# Patient Record
Sex: Male | Born: 1990 | Race: White | Hispanic: No | Smoking: Current every day smoker
Health system: Southern US, Community
[De-identification: ages and names within clinical notes are randomized; demographics above are authoritative.]

## PROBLEM LIST (undated history)

## (undated) DIAGNOSIS — M419 Scoliosis, unspecified: Secondary | ICD-10-CM

---

## 2014-11-19 ENCOUNTER — Emergency Department (HOSPITAL_COMMUNITY): Payer: No Typology Code available for payment source

## 2014-11-19 ENCOUNTER — Encounter (HOSPITAL_COMMUNITY): Payer: Self-pay | Admitting: Neurology

## 2014-11-19 ENCOUNTER — Emergency Department (HOSPITAL_COMMUNITY)
Admission: EM | Admit: 2014-11-19 | Discharge: 2014-11-19 | Disposition: A | Discharge status: Home / Self Care | Payer: No Typology Code available for payment source | Attending: Emergency Medicine | Admitting: Emergency Medicine

## 2014-11-19 DIAGNOSIS — Y9389 Activity, other specified: Secondary | ICD-10-CM | POA: Insufficient documentation

## 2014-11-19 DIAGNOSIS — M419 Scoliosis, unspecified: Secondary | ICD-10-CM | POA: Insufficient documentation

## 2014-11-19 DIAGNOSIS — Y998 Other external cause status: Secondary | ICD-10-CM | POA: Diagnosis not present

## 2014-11-19 DIAGNOSIS — S29001A Unspecified injury of muscle and tendon of front wall of thorax, initial encounter: Secondary | ICD-10-CM | POA: Diagnosis not present

## 2014-11-19 DIAGNOSIS — S199XXA Unspecified injury of neck, initial encounter: Secondary | ICD-10-CM | POA: Diagnosis present

## 2014-11-19 DIAGNOSIS — S299XXA Unspecified injury of thorax, initial encounter: Secondary | ICD-10-CM | POA: Diagnosis not present

## 2014-11-19 DIAGNOSIS — Y92410 Unspecified street and highway as the place of occurrence of the external cause: Secondary | ICD-10-CM | POA: Insufficient documentation

## 2014-11-19 DIAGNOSIS — M5020 Other cervical disc displacement, unspecified cervical region: Secondary | ICD-10-CM | POA: Insufficient documentation

## 2014-11-19 DIAGNOSIS — S0990XA Unspecified injury of head, initial encounter: Secondary | ICD-10-CM | POA: Insufficient documentation

## 2014-11-19 DIAGNOSIS — S161XXA Strain of muscle, fascia and tendon at neck level, initial encounter: Secondary | ICD-10-CM | POA: Diagnosis not present

## 2014-11-19 DIAGNOSIS — F17200 Nicotine dependence, unspecified, uncomplicated: Secondary | ICD-10-CM | POA: Insufficient documentation

## 2014-11-19 HISTORY — DX: Scoliosis, unspecified: M41.9

## 2014-11-19 MED ORDER — MORPHINE SULFATE (PF) 4 MG/ML IV SOLN
4.0000 mg | Freq: Once | INTRAVENOUS | Status: AC
  Administered 2014-11-19: 4 mg via INTRAVENOUS
  Filled 2014-11-19: qty 1

## 2014-11-19 MED ORDER — HYDROCODONE-ACETAMINOPHEN 5-325 MG PO TABS
1.0000 | ORAL_TABLET | Freq: Once | ORAL | Status: AC
  Administered 2014-11-19: 1 via ORAL
  Filled 2014-11-19: qty 1

## 2014-11-19 MED ORDER — HYDROCODONE-ACETAMINOPHEN 5-325 MG PO TABS: 1.0000 | ORAL_TABLET | ORAL | Status: AC | PRN

## 2014-11-19 NOTE — ED Notes (Signed)
Pt to MRI

## 2014-11-19 NOTE — ED Notes (Signed)
C spine cleared by Dr Criss AlvineGoldston, collar removed
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
## 2014-11-19 NOTE — ED Notes (Signed)
Discharge instructions and prescription reviewed - voiced understanding.  
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
